# Patient Record
Sex: Male | Born: 1960 | Smoking: Never smoker
Health system: Southern US, Community
[De-identification: ages and names within clinical notes are randomized; demographics above are authoritative.]

## PROBLEM LIST (undated history)

## (undated) HISTORY — PX: TONSILLECTOMY: SUR1361

## (undated) HISTORY — PX: VASECTOMY: SHX75

---

## 2011-12-26 ENCOUNTER — Ambulatory Visit (INDEPENDENT_AMBULATORY_CARE_PROVIDER_SITE_OTHER): Payer: BC Managed Care – PPO | Admitting: Physician Assistant

## 2011-12-26 VITALS — BP 122/82 | HR 77 | Temp 97.7°F | Resp 16 | Ht 68.5 in | Wt 177.0 lb

## 2011-12-26 DIAGNOSIS — J329 Chronic sinusitis, unspecified: Secondary | ICD-10-CM

## 2011-12-26 DIAGNOSIS — R0981 Nasal congestion: Secondary | ICD-10-CM

## 2011-12-26 DIAGNOSIS — J029 Acute pharyngitis, unspecified: Secondary | ICD-10-CM

## 2011-12-26 DIAGNOSIS — R0982 Postnasal drip: Secondary | ICD-10-CM

## 2011-12-26 DIAGNOSIS — J3489 Other specified disorders of nose and nasal sinuses: Secondary | ICD-10-CM

## 2011-12-26 MED ORDER — IPRATROPIUM BROMIDE 0.03 % NA SOLN: 2.0000 | Freq: Two times a day (BID) | NASAL | Status: DC

## 2011-12-26 NOTE — Patient Instructions (Signed)

## 2011-12-26 NOTE — Progress Notes (Signed)
Discussed, reviewed and agree.  

## 2011-12-26 NOTE — Progress Notes (Signed)
  Subjective:    Patient ID: Kevin Duffy, male    DOB: 01/19/1961, 51 y.o.   MRN: [id number]  HPI  [name] is a 51 yr old male with a 5 day history of sore throat and nasal congestion.  His symptoms began Sunday evening with a scratchy throat.  He has since developed nasal congestion, post-nasal drainage, and left ear fullness.  [place] the [place] with family, including his nephew who is being treated for strep throat.  He denies cough and headache.  Subjective fever, tmax 99.7.  He has been using Dayquil and Nyquil for symptom relief.   Review of Systems  Constitutional: Positive for fever and fatigue. Negative for appetite change.  HENT: Positive for congestion, rhinorrhea and postnasal drip. Negative for ear pain, sneezing, neck pain and neck stiffness.   Eyes: Negative.   Respiratory: Negative for cough, shortness of breath and wheezing.   Cardiovascular: Negative.   Gastrointestinal: Negative.   Musculoskeletal: Positive for myalgias.  Neurological: Negative for dizziness, light-headedness and headaches.       Objective:   Physical Exam  Constitutional: He is oriented to person, place, and time. He appears well-developed and well-nourished. No distress.  HENT:  Right Ear: External ear normal.  Left Ear: External ear normal.  Nose: Mucosal edema and rhinorrhea present.  Mouth/Throat: Uvula is midline and mucous membranes are normal. Posterior oropharyngeal erythema present. No oropharyngeal exudate.       Copious cerumen bilaterally obscurring TMs  Cardiovascular: Normal rate, regular rhythm and normal heart sounds.   Pulmonary/Chest: Breath sounds normal. He has no wheezes. He has no rales.  Lymphadenopathy:    He has no cervical adenopathy.  Neurological: He is alert and oriented to person, place, and time.  Skin: Skin is warm and dry.      Results for orders placed in visit on 12/26/11  POCT RAPID STREP A (OFFICE)      Component Value Range   Rapid  Strep A Screen Negative  Negative       Assessment & Plan:   1. Sore throat  POCT rapid strep A, Culture, Group A Strep - rapid strep NEG  2. Nasal congestion  ipratropium (ATROVENT) 0.03 % nasal spray  3. Post-nasal drainage     Likely viral URI.  Instructed patient in home care for symptomatic relief.  Will follow-up with culture results and adjust plan accordingly.  Patient will return if worsening or not improving.

## 2011-12-28 LAB — CULTURE, GROUP A STREP: Organism ID, Bacteria: NORMAL

## 2012-10-16 ENCOUNTER — Ambulatory Visit (INDEPENDENT_AMBULATORY_CARE_PROVIDER_SITE_OTHER): Payer: BC Managed Care – PPO | Admitting: Emergency Medicine

## 2012-10-16 VITALS — BP 112/60 | HR 73 | Temp 97.6°F | Resp 16 | Ht 70.0 in | Wt 184.0 lb

## 2012-10-16 DIAGNOSIS — M2550 Pain in unspecified joint: Secondary | ICD-10-CM

## 2012-10-16 DIAGNOSIS — W57XXXA Bitten or stung by nonvenomous insect and other nonvenomous arthropods, initial encounter: Secondary | ICD-10-CM

## 2012-10-16 LAB — POCT CBC
HCT, POC: 47.2 % (ref 43.5–53.7)
Hemoglobin: 15.4 g/dL (ref 14.1–18.1)
Lymph, poc: 1.8 (ref 0.6–3.4)
MCH, POC: 32.2 pg — AB (ref 27–31.2)
MCHC: 32.6 g/dL (ref 31.8–35.4)
MCV: 98.8 fL — AB (ref 80–97)
POC LYMPH PERCENT: 31.9 %L (ref 10–50)
RDW, POC: 13.7 %
WBC: 5.5 10*3/uL (ref 4.6–10.2)

## 2012-10-16 LAB — POCT SEDIMENTATION RATE: POCT SED RATE: 9 mm/hr (ref 0–22)

## 2012-10-16 MED ORDER — MELOXICAM 15 MG PO TABS: 15.0000 mg | ORAL_TABLET | Freq: Every day | ORAL | Status: DC

## 2012-10-16 NOTE — Progress Notes (Signed)
[facility] [address], [place] [address] [phone]  Date:  10/16/2012   Name:  Kevin Duffy   DOB:  May 15, 1960   MRN:  [id number]  PCP:  No primary provider on file.    Chief Complaint: Elbow Pain and Tick Removal   History of Present Illness:  Kevin Duffy is a 52 y.o. very pleasant male patient who presents with the following:  Removed a tick 6 weeks ago from his right hip and had a local rash.  No fever or chills, no generalized rash. For the past month he has experienced pain in both elbows.  No swelling or warmth or overuse or injury.  No history of arthritis.  No malaise, weakness, etc. No improvement with over the counter medications or other home remedies. Denies other complaint or health concern today.   There are no active problems to display for this patient.   History reviewed. No pertinent past medical history.  Past Surgical History  Procedure Laterality Date  . Vasectomy    . Tonsillectomy      History  Substance Use Topics  . Smoking status: Never Smoker   . Smokeless tobacco: Not on file  . Alcohol Use: No    Family History  Problem Relation Age of Onset  . Cancer Maternal Grandfather   . Diverticulitis Paternal Grandmother     No Known Allergies  Medication list has been reviewed and updated.  Current Outpatient Prescriptions on File Prior to Visit  Medication Sig Dispense Refill  . ipratropium (ATROVENT) 0.03 % nasal spray Place 2 sprays into the nose 2 (two) times daily.  30 mL  5   No current facility-administered medications on file prior to visit.    Review of Systems:  As per HPI, otherwise negative.    Physical Examination: Filed Vitals:   10/16/12 0822  BP: 112/60  Pulse: 73  Temp: 97.6 F (36.4 C)  Resp: 16   Filed Vitals:   10/16/12 0822  Height: 5\' 10"  (1.778 m)  Weight: 184 lb (83.462 kg)   Body mass index is 26.4 kg/(m^2). Ideal Body Weight: Weight in (lb) to have BMI = 25:  173.9   GEN: WDWN, NAD, Non-toxic, Alert & Oriented x 3 HEENT: Atraumatic, Normocephalic.  Ears and Nose: No external deformity. EXTR: No clubbing/cyanosis/edema NEURO: Normal gait.  PSYCH: Normally interactive. Conversant. Not depressed or anxious appearing.  Calm demeanor.  ELBOWS:  Bilaterally not tender, no effusion or redness.  Full active and passive ROM.    Assessment and Plan: Tick infestation Arthralgia mobic Labs   Signed,  Phillips Odor, MD

## 2012-10-21 LAB — EHRLICHIA ANTIBODY PANEL
E chaffeensis (HGE) Ab, IgG: <1:64 {titer}
E chaffeensis (HGE) Ab, IgM: <1:20 {titer}

## 2013-04-27 ENCOUNTER — Other Ambulatory Visit: Payer: Self-pay | Admitting: Family Medicine

## 2013-04-27 DIAGNOSIS — R1013 Epigastric pain: Secondary | ICD-10-CM

## 2013-05-03 ENCOUNTER — Ambulatory Visit
Admission: RE | Admit: 2013-05-03 | Discharge: 2013-05-03 | Disposition: A | Discharge status: Home / Self Care | Payer: BC Managed Care – PPO | Source: Ambulatory Visit | Attending: Family Medicine | Admitting: Family Medicine

## 2013-05-03 DIAGNOSIS — R1013 Epigastric pain: Secondary | ICD-10-CM

## 2014-08-09 IMAGING — US US ABDOMEN LIMITED
1 series · 14 of 25 positions shown · non-contrast
Comparison: None.

CLINICAL DATA: Gallstones, abdominal pain

EXAM:
US ABDOMEN LIMITED - RIGHT UPPER QUADRANT

[Series 1: us abdomen limited · 0.24mm/px · 14 of 48 slices shown]
[im 1/48]
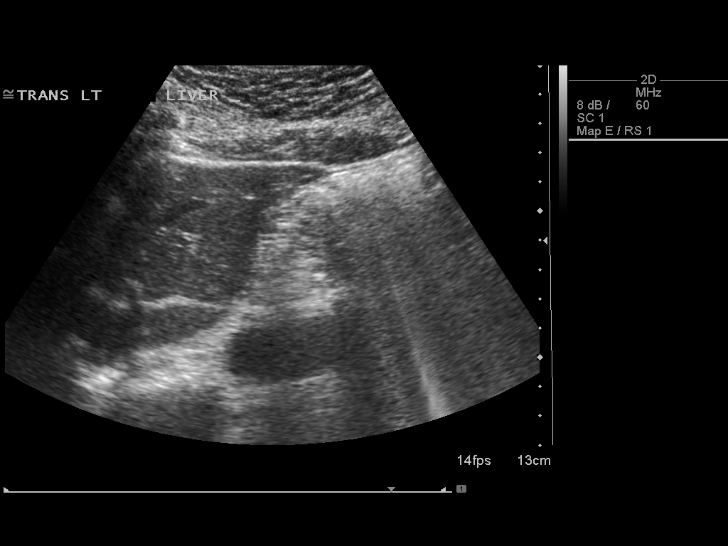
[im 4/48]
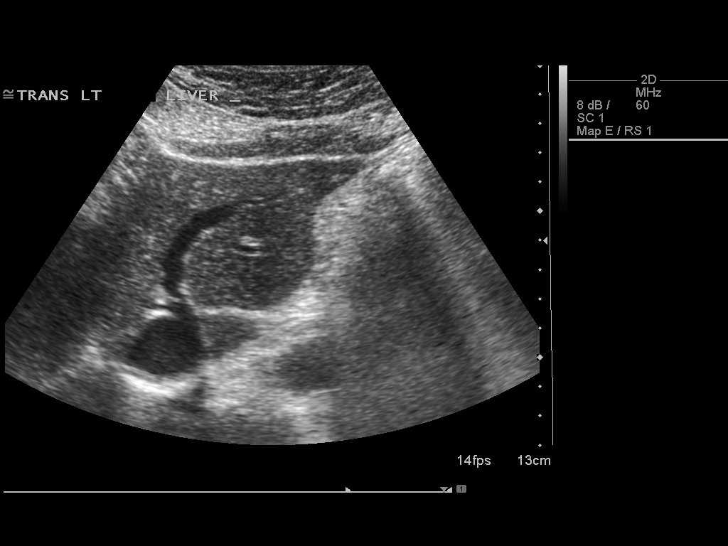
[im 8/48]
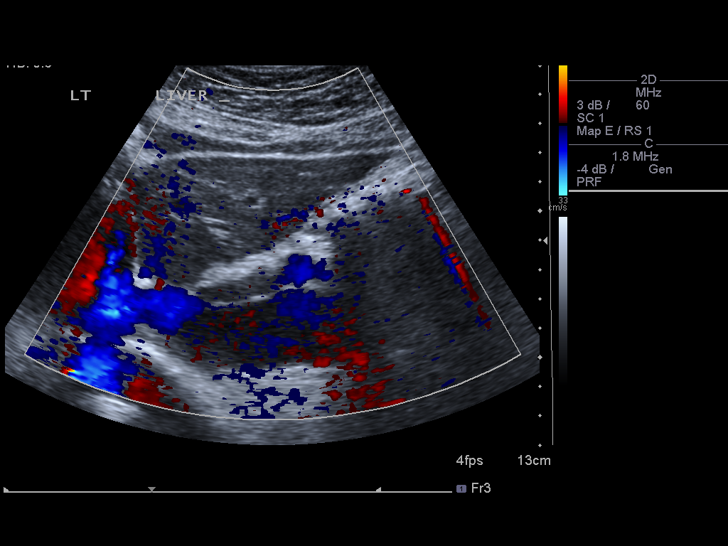
[im 12/48]
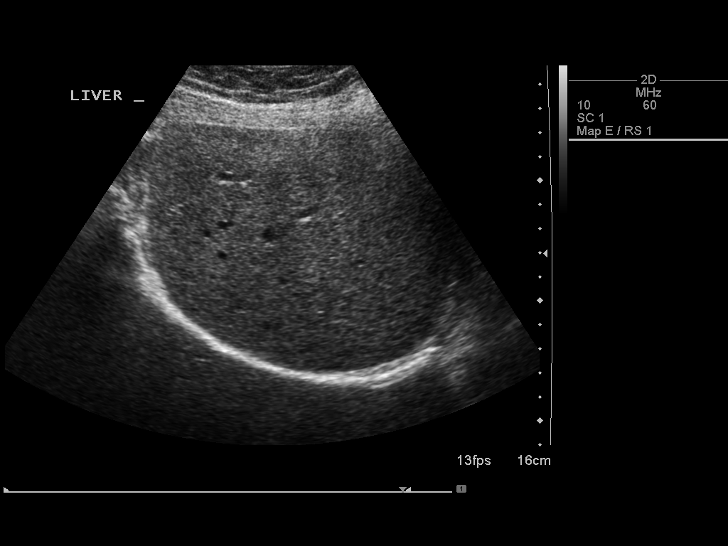
[im 16/48]
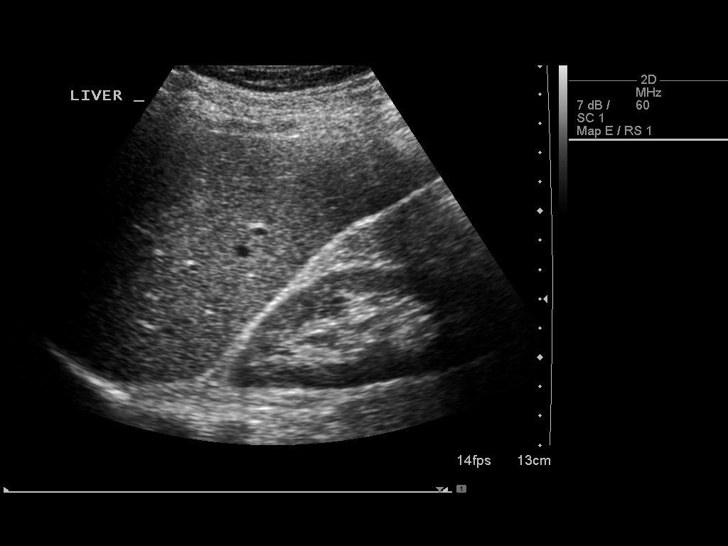
[im 18/48]
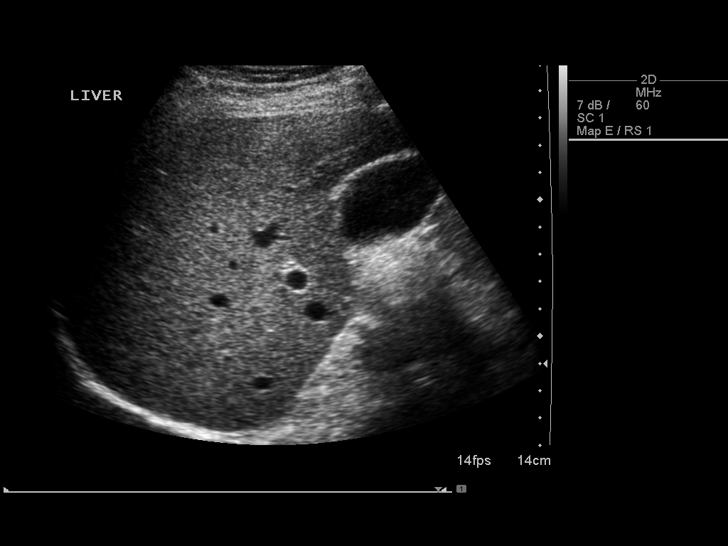
[im 22/48]
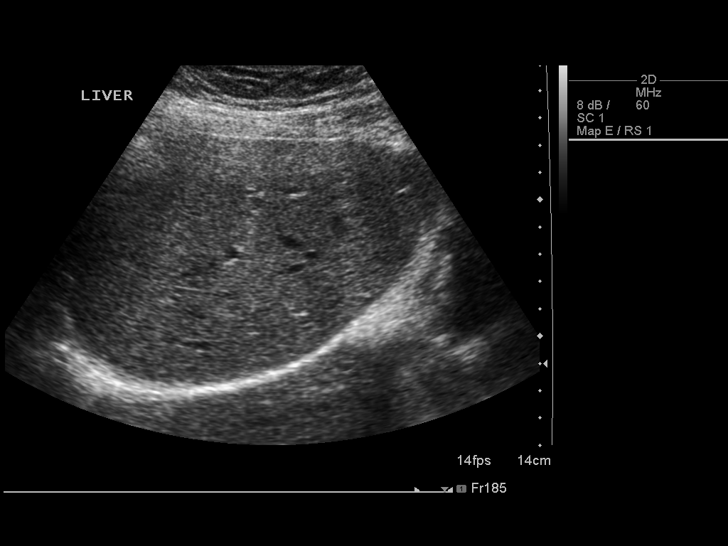
[im 26/48]
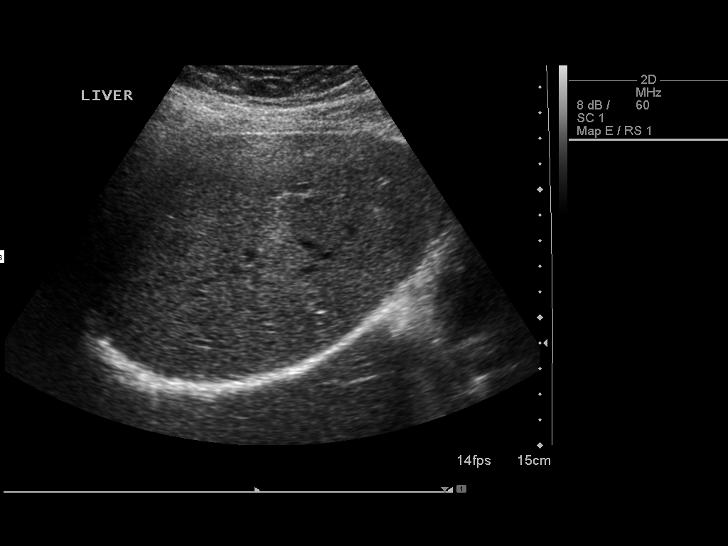
[im 30/48]
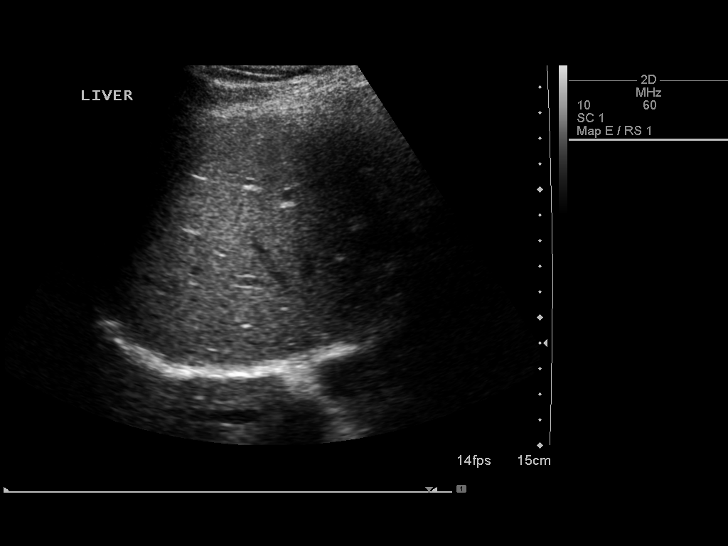
[im 32/48]
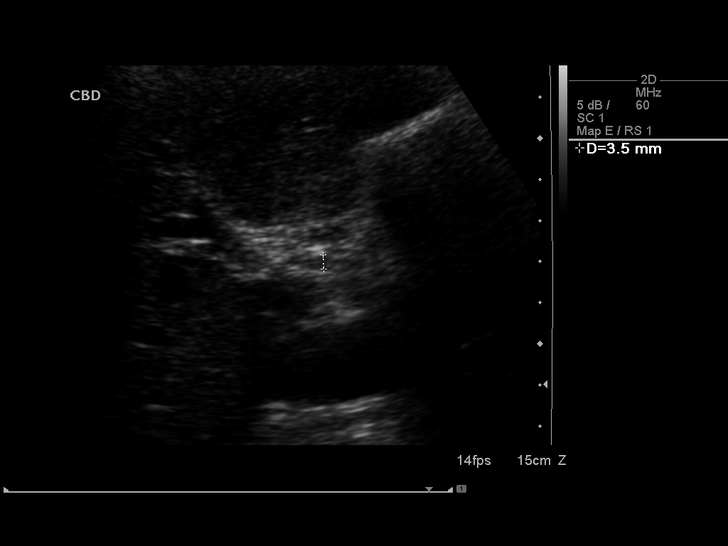
[im 36/48]
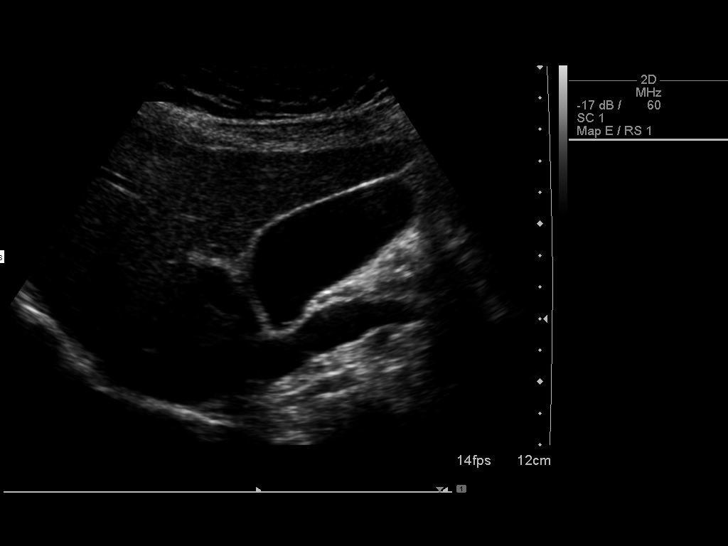
[im 40/48]
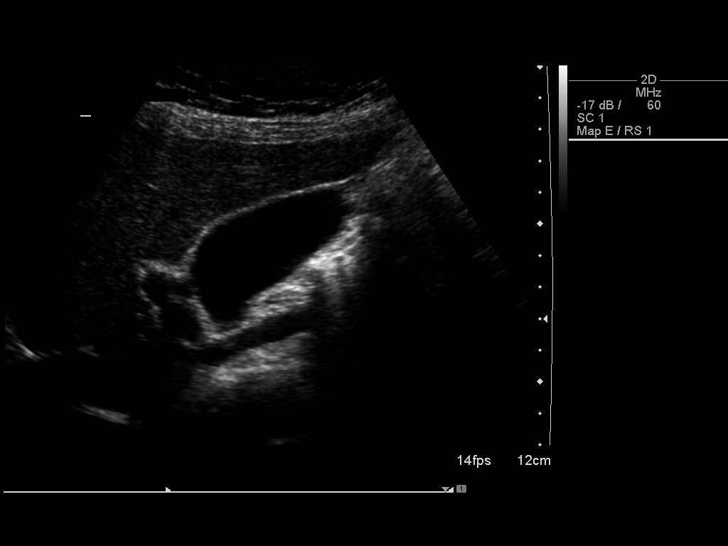
[im 44/48]
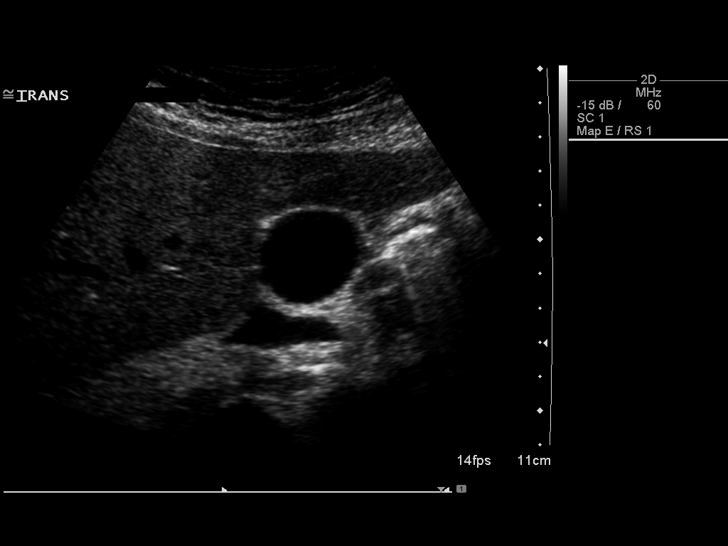
[im 48/48]
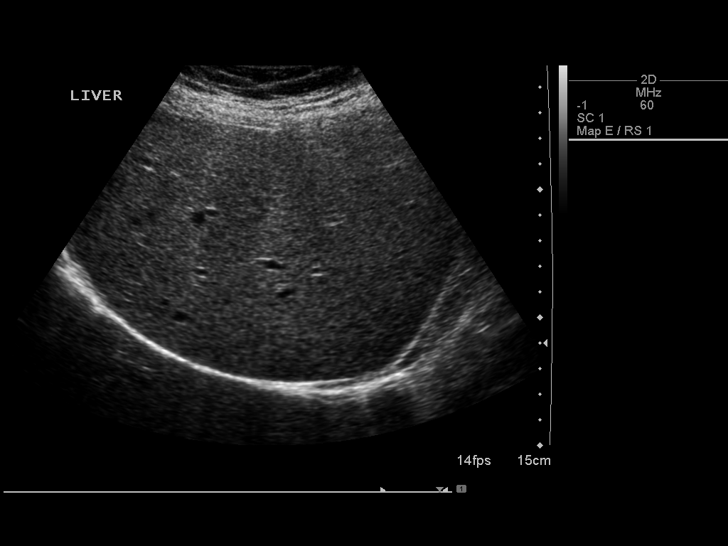

[14 of 25 positions shown; findings below may reference images not displayed]

FINDINGS: Gallbladder:

No gallstones or wall thickening visualized. No sonographic Murphy
sign noted.

Common bile duct:

Diameter: 3.5 mm

Liver:

No focal lesion identified. Within normal limits in parenchymal
echogenicity.
IMPRESSION: Normal right upper quadrant ultrasound.

## 2015-10-11 ENCOUNTER — Ambulatory Visit (INDEPENDENT_AMBULATORY_CARE_PROVIDER_SITE_OTHER): Payer: BLUE CROSS/BLUE SHIELD | Admitting: Emergency Medicine

## 2015-10-11 VITALS — BP 122/72 | HR 65 | Temp 97.8°F | Resp 17 | Ht 69.5 in | Wt 180.0 lb

## 2015-10-11 DIAGNOSIS — J0100 Acute maxillary sinusitis, unspecified: Secondary | ICD-10-CM | POA: Diagnosis not present

## 2015-10-11 MED ORDER — AMOXICILLIN-POT CLAVULANATE 875-125 MG PO TABS: 1.0000 | ORAL_TABLET | Freq: Two times a day (BID) | ORAL | Status: DC

## 2015-10-11 MED ORDER — FLUTICASONE PROPIONATE 50 MCG/ACT NA SUSP: 2.0000 | Freq: Every day | NASAL | Status: DC

## 2015-10-11 NOTE — Patient Instructions (Signed)

## 2015-10-11 NOTE — Progress Notes (Signed)
      Chief Complaint:  Chief Complaint  Patient presents with  . Sinusitis  . Cough  . URI    HPI: Kevin Duffy is a 55 y.o. male [facility] complaining ofAn episode 2 weeks ago where he had sStacey Drainignificant sore throat and nasal congestion. Yellow drainage at that time but over 2-3 days it improved. He seemed to be doing okay until Sunday night when he had a recurrence of his symptoms. He now has facial discomfort along with a grayish yellow nasal drainage. He has been using DayQuil and NyQuil for symptoms.  No past medical history on file. Past Surgical History  Procedure Laterality Date  . Vasectomy    . Tonsillectomy     Social History   Social History  . Marital Status: Unknown    Spouse Name: N/A  . Number of Children: N/A  . Years of Education: N/A   Social History Main Topics  . Smoking status: Never Smoker   . Smokeless tobacco: None  . Alcohol Use: No  . Drug Use: No  . Sexual Activity: Not Asked   Other Topics Concern  . None   Social History Narrative   Family History  Problem Relation Age of Onset  . Cancer Maternal Grandfather   . Diverticulitis Paternal Grandmother    No Known Allergies Prior to Admission medications   Medication Sig Start Date End Date Taking? Authorizing Provider  amoxicillin-clavulanate (AUGMENTIN) 875-125 MG tablet Take 1 tablet by mouth 2 (two) times daily. 10/11/15   Collene GobbleSteven A Daub, MD  fluticasone (FLONASE) 50 MCG/ACT nasal spray Place 2 sprays into both nostrils daily. 10/11/15   Collene GobbleSteven A Daub, MD     ROS: The patient denies fevers, chills, night sweats, unintentional weight loss, chest pain, palpitations, wheezing, dyspnea on exertion, nausea, vomiting, abdominal pain, dysuria, hematuria, melena, numbness, weakness, or tingling.   All other systems have been reviewed and were otherwise negative with the exception of those mentioned in the HPI and as above.    PHYSICAL EXAM: Filed Vitals:   10/11/15 0822  BP:  122/72  Pulse: 65  Temp: 97.8 F (36.6 C)  Resp: 17   Body mass index is 26.21 kg/(m^2).   General: Alert, no acute distress HEENT:  Normocephalic, atraumatic, oropharynx patent.There is wax in the left external auditory canal. Right TM normalnormal throat slightly red. [facility] Cardiovascular:  Regular rate and rhythm, no rubs murmurs or gallops.  No Carotid bruits, radial pulse intact. No pedal edema.  Respiratory: Clear to auscultation bilaterally.  No wheezes, rales, or rhonchi.  No cyanosis, no use of accessory musculature Abdominal: No organomegaly, abdomen is soft and non-tender, positive bowel sounds.  No masses. Musculoskeletal: Gait intact. No edema, tenderness Skin: No rashes. Neurologic: Facial musculature symmetric. Psychiatric: Patient acts appropriately throughout our interaction. Lymphatic: No cervical or submandibular lymphadenopathy    LABS:    EKG/XRAY:   [facility]   ASSESSMENT/PLAN: Patient presents with an acute maxillary sinusitis. Will treat with Flonase and Augmentin. He states that amoxicillin does not work for him anymore. He was given information about care for sinusitis.   Gross sideeffects, risk and benefits, and alternatives of medications d/w patient. Patient is aware that all medications have potential sideeffects and we are unable to predict every sideeffect or drug-drug interaction that may occur.  Lesle ChrisSteven Daub MD 10/11/2015 8:44 AM

## 2015-11-30 ENCOUNTER — Encounter: Payer: Self-pay | Admitting: Physician Assistant

## 2015-11-30 ENCOUNTER — Ambulatory Visit (INDEPENDENT_AMBULATORY_CARE_PROVIDER_SITE_OTHER): Payer: BLUE CROSS/BLUE SHIELD | Admitting: Physician Assistant

## 2015-11-30 ENCOUNTER — Ambulatory Visit (INDEPENDENT_AMBULATORY_CARE_PROVIDER_SITE_OTHER): Payer: BLUE CROSS/BLUE SHIELD

## 2015-11-30 VITALS — BP 114/80 | HR 70 | Temp 98.3°F | Resp 16 | Ht 70.0 in | Wt 190.4 lb

## 2015-11-30 DIAGNOSIS — S61219A Laceration without foreign body of unspecified finger without damage to nail, initial encounter: Secondary | ICD-10-CM | POA: Diagnosis not present

## 2015-11-30 DIAGNOSIS — M25542 Pain in joints of left hand: Secondary | ICD-10-CM | POA: Diagnosis not present

## 2015-11-30 DIAGNOSIS — S61215A Laceration without foreign body of left ring finger without damage to nail, initial encounter: Secondary | ICD-10-CM | POA: Diagnosis not present

## 2015-11-30 NOTE — Patient Instructions (Addendum)
WOUND CARE Please return in 10 days to have your stitches/staples removed or sooner if you have concerns. . Keep area clean and dry for 24 hours. Do not remove bandage, if applied. . After 24 hours, remove bandage and wash wound gently with mild soap and warm water. Reapply a new bandage after cleaning wound, if directed. . Continue daily cleansing with soap and water until stitches/staples are removed. . Do not apply any ointments or creams to the wound while stitches/staples are in place, as this may cause delayed healing. . Notify the office if you experience any of the following signs of infection: Swelling, redness, pus drainage, streaking, fever >101.0 F . Notify the office if you experience excessive bleeding that does not stop after 15-20 minutes of constant, firm pressure.      IF you received an x-ray today, you will receive an invoice from [facility]. Please contact [facility] at [phone] with questions or concerns regarding your invoice.   IF you received labwork today, you will receive an invoice from [facility]. Please contact [facility] at [phone] with questions or concerns regarding your invoice.   Our billing staff will not be able to assist you with questions regarding bills from these companies.  You will be contacted with the lab results as soon as they are available. The fastest way to get your results is to activate your My Chart account. Instructions are located on the last page of this paperwork. If you have not heard from us regarding the results in 2 weeks, please contact this office.      

## 2015-11-30 NOTE — Progress Notes (Signed)
   11/30/2015 3:39 PM   DOB: 1961-01-25 / MRN: [id number]  SUBJECTIVE:  Kevin Duffy is a 55 y.o. male presenting for a laceration to the left ring finger. 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  States a 7 lbs object landed on the left ring pip on the dorsum, and the object he was hold caused a laceration on the palmar surface.  He denies a change in function of the finger, swelling, and exquisite pain with movement.  No paresthesia distal the injury.    He has No Known Allergies.   He  has no past medical history on file.    He  reports that he has never smoked. He does not have any smokeless tobacco history on file. He reports that he does not drink alcohol or use drugs. He  has no sexual activity history on file. The patient  has a past surgical history that includes Vasectomy and Tonsillectomy.  His family history includes Cancer in his maternal grandfather; Diverticulitis in his paternal grandmother.  Review of Systems  Musculoskeletal: Positive for joint pain. Negative for back pain and falls.  Neurological: Negative for tingling, sensory change and focal weakness.    The problem list and medications were reviewed and updated by myself where necessary and exist elsewhere in the encounter.   OBJECTIVE:  BP 114/80 (BP Location: Right Arm, Patient Position: Sitting, Cuff Size: Normal)   Pulse 70   Temp 98.3 F (36.8 C) (Oral)   Resp 16   Ht 5\' 10"  (1.778 m)   Wt 190 lb 6.4 oz (86.4 kg)   SpO2 97%   BMI 27.32 kg/m   Physical Exam  Constitutional: He is oriented to person, place, and time.  Cardiovascular: Normal rate.   Pulmonary/Chest: Effort normal.  Musculoskeletal: Normal range of motion. He exhibits no edema, tenderness or deformity.  Neurological: He is alert and oriented to person, place, and time. No cranial nerve deficit.  Skin: Skin is warm and dry.  Laceration without tendon involvement over the anterior aspect of the 4th left PIP.      No results  found for this or any previous visit (from the past 72 hour(s)).  No results found.  ASSESSMENT AND PLAN  Kevin Duffy was seen today for other.  Diagnoses and all orders for this visit:  Finger laceration, initial encounter: Repaired here.  Negative for fracture.  Will see him back in ten days for suture removal.  -     DG Finger Ring Left; Future    The patient is advised to call or return to clinic if he does not see an improvement in symptoms, or to seek the care of the closest emergency department if he worsens with the above plan.   Deliah BostonMichael Clark, MHS, PA-C [facility] 11/30/2015 3:39 PM

## 2015-12-11 ENCOUNTER — Ambulatory Visit (INDEPENDENT_AMBULATORY_CARE_PROVIDER_SITE_OTHER): Payer: BLUE CROSS/BLUE SHIELD | Admitting: Physician Assistant

## 2015-12-11 VITALS — BP 110/66 | HR 64 | Temp 98.0°F | Resp 18

## 2015-12-11 DIAGNOSIS — Z4802 Encounter for removal of sutures: Secondary | ICD-10-CM

## 2015-12-11 NOTE — Patient Instructions (Signed)
     IF you received an x-ray today, you will receive an invoice from [facility]. Please contact [facility] at [phone] with questions or concerns regarding your invoice.   IF you received labwork today, you will receive an invoice from [facility]. Please contact [facility] at [phone] with questions or concerns regarding your invoice.   Our billing staff will not be able to assist you with questions regarding bills from these companies.  You will be contacted with the lab results as soon as they are available. The fastest way to get your results is to activate your My Chart account. Instructions are located on the last page of this paperwork. If you have not heard from us regarding the results in 2 weeks, please contact this office.      

## 2015-12-11 NOTE — Progress Notes (Signed)
   12/11/2015 1:38 PM   DOB: [birth year] / MRN: [id number]  SUBJECTIVE:  Mr. Fran LowesHolder is a well appearing 55 y.o. here today for wound care. He denies exquisite tenderness at the site of the wound, nausea, emesis, fever and chills.  He has been compliant with medical therapy and recommendations thus far.   He has No Known Allergies.   He  has no past medical history on file.    He  reports that he has never smoked. He has never used smokeless tobacco. He reports that he does not drink alcohol or use drugs. He  has no sexual activity history on file. The patient  has a past surgical history that includes Vasectomy and Tonsillectomy.  His family history includes Cancer in his maternal grandfather; Diverticulitis in his paternal grandmother.  Review of Systems  Neurological: Negative for tingling and focal weakness.    Problem list and medications reviewed and updated by myself where necessary, and exist elsewhere in the encounter.   OBJECTIVE:  BP 110/66 (BP Location: Right Arm, Patient Position: Sitting, Cuff Size: Small)   Pulse 64   Temp 98 F (36.7 C) (Oral)   Resp 18   SpO2 99%  CrCl cannot be calculated (No order found.).  Physical Exam  Musculoskeletal:       Hands: Vitals reviewed.   No results found for this or any previous visit (from the past 48 hour(s)).  ASSESSMENT AND PLAN  Channing MuttersRoy was seen today for suture / staple removal.  Diagnoses and all orders for this visit:  Visit for suture removal: RTC as needed.      The patient was advised to call or return to clinic if he does not see an improvement in symptoms or to seek the care of the closest emergency department if he worsens with the above plan.   Deliah BostonMichael Opha Mcghee, MHS, PA-C [facility] 12/11/2015 1:38 PM

## 2016-05-07 ENCOUNTER — Ambulatory Visit (INDEPENDENT_AMBULATORY_CARE_PROVIDER_SITE_OTHER): Payer: Self-pay | Admitting: Physician Assistant

## 2016-05-07 VITALS — BP 118/76 | HR 69 | Temp 97.6°F | Resp 16 | Ht 69.0 in | Wt 189.0 lb

## 2016-05-07 DIAGNOSIS — J069 Acute upper respiratory infection, unspecified: Secondary | ICD-10-CM

## 2016-05-07 MED ORDER — DOXYCYCLINE HYCLATE 100 MG PO CAPS: 100.0000 mg | ORAL_CAPSULE | Freq: Two times a day (BID) | ORAL | 0 refills | Status: AC

## 2016-05-07 NOTE — Patient Instructions (Addendum)
Drink lots of fluids and stay the course.  I doubt you will need an antibiotic.     IF you received an x-ray today, you will [facility]. Please contact [facility] at [phone] with questions or concerns regarding your invoice.   IF you received labwork today, you will receive an invoice from [facility]. Please contact [facility] at [phone] with questions or concerns regarding your invoice.   Our billing staff will not be able to assist you with questions regarding bills from these companies.  You will be contacted with the lab results as soon as they are available. The fastest way to get your results is to activate your My Chart account. Instructions are located on the last page of this paperwork. If you have not heard from us regarding the results in 2 weeks, please contact this office.

## 2016-05-07 NOTE — Progress Notes (Signed)
05/07/2016 2:19 PM   DOB: January 19, 1961 / MRN: [id number]  SUBJECTIVE:  Kevin Duffy is a 56 y.o. male presenting for sinus congestion for 10 days. Complains of low grade fever at night measuring 99-100.  Associates fatigue, teeth pain.  Did have scratchy throat but this resolved.  Has been coughing but this is not a predominate symptoms. He felt good this morning but this dissipated quickly.  Taking nyquil and dayquil. Also taking supplements and vitamins.    He has No Known Allergies.   He  has no past medical history on file.    He  reports that he has never smoked. He has never used smokeless tobacco. He reports that he does not drink alcohol or use drugs. He  has no sexual activity history on file. The patient  has a past surgical history that includes Vasectomy and Tonsillectomy.  His family history includes Cancer in his maternal grandfather; Diverticulitis in his paternal grandmother.  Review of Systems  Constitutional: Negative for chills, diaphoresis and fever.  HENT: Positive for congestion and sinus pain. Negative for sore throat.   Respiratory: Positive for sputum production. Negative for hemoptysis and shortness of breath.   Cardiovascular: Negative for chest pain and leg swelling.  Musculoskeletal: Negative for myalgias.  Skin: Negative for itching and rash.  Neurological: Negative for dizziness.    The problem list and medications were reviewed and updated by myself where necessary and exist elsewhere in the encounter.   OBJECTIVE:  BP 118/76 (BP Location: Right Arm, Patient Position: Sitting, Cuff Size: Normal)   Pulse 69   Temp 97.6 F (36.4 C) (Oral)   Resp 16   Ht 5\' 9"  (1.753 m)   Wt 189 lb (85.7 kg)   SpO2 97%   BMI 27.91 kg/m   Physical Exam  Constitutional: He is oriented to person, place, and time. He appears well-developed. He does not appear ill.  HENT:  Right Ear: Tympanic membrane normal.  Left Ear: Tympanic membrane normal.  Nose: Nose normal.   Mouth/Throat: Uvula is midline, oropharynx is clear and moist and mucous membranes are normal.  Eyes: Conjunctivae and EOM are normal. Pupils are equal, round, and reactive to light.  Cardiovascular: Normal rate and regular rhythm.   Pulmonary/Chest: Effort normal.  Abdominal: He exhibits no distension.  Musculoskeletal: Normal range of motion.  Neurological: He is alert and oriented to person, place, and time. No cranial nerve deficit. Coordination normal.  Skin: Skin is warm and dry. He is not diaphoretic.  Psychiatric: He has a normal mood and affect.  Nursing note and vitals reviewed.   No results found for this or any previous visit (from the past 72 hour(s)).  No results found.  ASSESSMENT AND PLAN:  La was seen today for sinus problem.  Diagnoses and all orders for this visit:  Acute URI Comments: Some symptoms of a ABRS howver he feels that he is improving.  He will hold doxy for the next 3 days and fill if he is not improving.  Orders: -     doxycycline (VIBRAMYCIN) 100 MG capsule; Take 1 capsule (100 mg total) by mouth 2 (two) times daily. No need to fill if you are feeling better in three days.    The patient is advised to call or return to clinic if he does not see an improvement in symptoms, or to seek the care of the closest emergency department if he worsens with the above plan.   Deliah Boston, MHS, PA-C [facility] 05/07/2016 2:19 PM
